# Patient Record
Sex: Male | Born: 2000 | Race: White | Hispanic: No | Marital: Single | Smoking: Never smoker
Health system: Southern US, Community
[De-identification: ages and names within clinical notes are randomized; demographics above are authoritative.]

---
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
## 2018-10-15 ENCOUNTER — Ambulatory Visit (HOSPITAL_COMMUNITY)
Admission: EM | Admit: 2018-10-15 | Discharge: 2018-10-15 | Disposition: A | Discharge status: Home / Self Care | Payer: BLUE CROSS/BLUE SHIELD | Attending: Internal Medicine | Admitting: Internal Medicine

## 2018-10-15 ENCOUNTER — Encounter (HOSPITAL_COMMUNITY): Payer: Self-pay | Admitting: Emergency Medicine

## 2018-10-15 ENCOUNTER — Other Ambulatory Visit: Payer: Self-pay

## 2018-10-15 DIAGNOSIS — H00014 Hordeolum externum left upper eyelid: Secondary | ICD-10-CM

## 2018-10-15 MED ORDER — ERYTHROMYCIN 5 MG/GM OP OINT: TOPICAL_OINTMENT | OPHTHALMIC | 0 refills | Status: AC

## 2018-10-15 NOTE — ED Triage Notes (Signed)
Pt sts left eye pain with swelling x 2 days

## 2018-10-15 NOTE — ED Provider Notes (Signed)
[facility]    CSN: [id number] Arrival date & [id number]: 10/15/18  1941     History   Chief Complaint Chief Complaint  Patient presents with  . Eye Pain    HPI Justin Graves is a 18 y.o. male with no past medical history comes to urgent care with complaints of left eye pain and swelling with increased tearing of the left eye of 2 days duration.  Symptoms started 2 days ago and is gotten progressively worse.  Patient tried warm compress and over-the-counter saline eyedrops with no improvement in his symptoms.  Patient says that the eyelid is painful.  Pain is constant and throbbing.  No relieving factors.  Aggravated by palpation.  No fever or chills.  No double vision.  No blurred vision.   HPI  History reviewed. No pertinent past medical history.  There are no active problems to display for this patient.   History reviewed. No pertinent surgical history.     Home Medications    Prior to Admission medications   Medication Sig Start Date End Date Taking? Authorizing Provider  erythromycin ophthalmic ointment Place a 1/2 inch ribbon of ointment into the lower eyelid. 10/15/18   Lamptey, Britta MccreedyPhilip O, MD    Family History History reviewed. No pertinent family history.  Social History Social History   Tobacco Use  . Smoking status: Never Smoker  . Smokeless tobacco: Never Used  Substance Use Topics  . Alcohol use: Not Currently    Frequency: Never  . Drug use: Never     Allergies   Sulfa antibiotics   Review of Systems Review of Systems  Constitutional: Negative for activity change, chills, fatigue and fever.  Eyes: Positive for pain, discharge and redness. Negative for photophobia, itching and visual disturbance.  Respiratory: Negative.   Cardiovascular: Negative.   Gastrointestinal: Negative.   Genitourinary: Negative.   Musculoskeletal: Negative.   Neurological: Negative for dizziness, weakness, numbness and headaches.     Physical Exam  Triage Vital Signs ED Triage Vitals  Enc Vitals Group     BP 10/15/18 1954 (!) 145/84     Pulse Rate 10/15/18 1954 86     Resp 10/15/18 1954 16     Temp 10/15/18 1954 98.5 F (36.9 C)     Temp Source 10/15/18 1954 Temporal     SpO2 10/15/18 1954 98 %     Weight --      Height --      Head Circumference --      Peak Flow --      Pain Score 10/15/18 1956 5     Pain Loc --      Pain Edu? --      Excl. in GC? --    No data found.  Updated Vital Signs BP (!) 145/84 (BP Location: Right Arm)   Pulse 86   Temp 98.5 F (36.9 C) (Temporal)   Resp 16   SpO2 98%   Visual Acuity Right Eye Distance:   Left Eye Distance:   Bilateral Distance:    Right Eye Near: R Near: 20/25 without corrective lens Left Eye Near:  L Near: 20/25 without corrective lens Bilateral Near:  20/25 without corrective lens  Physical Exam Vitals signs and nursing note reviewed.  Constitutional:      General: He is in acute distress.     Appearance: He is ill-appearing. He is not toxic-appearing.  HENT:     Right Ear: Tympanic membrane normal.  Left Ear: Tympanic membrane normal.     Nose: Nose normal.  Eyes:     Extraocular Movements: Extraocular movements intact.     Pupils: Pupils are equal, round, and reactive to light.     Comments: Mild conjunctival erythema in the left eye.  Left upper eyelid swelling without erythema.  Cardiovascular:     Rate and Rhythm: Normal rate and regular rhythm.  Pulmonary:     Effort: Pulmonary effort is normal.     Breath sounds: Normal breath sounds.  Abdominal:     General: Bowel sounds are normal.     Palpations: Abdomen is soft.  Skin:    General: Skin is warm.     Capillary Refill: Capillary refill takes less than 2 seconds.     Coloration: Skin is not jaundiced.     Findings: No bruising.  Neurological:     General: No focal deficit present.     Mental Status: He is alert and oriented to person, place, and time.      UC Treatments / Results   Labs (all labs ordered are listed, but only abnormal results are displayed) Labs Reviewed - No data to display  EKG   Radiology No results found.  Procedures Procedures (including critical care time)  Medications Ordered in UC Medications - No data to display  Initial Impression / Assessment and Plan / UC Course  I have reviewed the triage vital signs and the nursing notes.  Pertinent labs & imaging results that were available during my care of the patient were reviewed by me and considered in my medical decision making (see chart for details).     1.  Stye: Erythromycin eye ointment Continue warm compress If patient experiences worsening swelling of the left upper eyelid, fever, change in vision or pain with extraocular movement, patient is advised to return to urgent care for reevaluation.   Final Clinical Impressions(s) / UC Diagnoses   Final diagnoses:  Hordeolum externum of left upper eyelid   Discharge Instructions   None    ED Prescriptions    Medication Sig Dispense Auth. Provider   erythromycin ophthalmic ointment Place a 1/2 inch ribbon of ointment into the lower eyelid. 3.5 g Chase Picket, MD     Controlled Substance Prescriptions [place] Controlled Substance Registry consulted? No   Chase Picket, MD 10/17/18 1325

## 2022-04-03 ENCOUNTER — Emergency Department (HOSPITAL_COMMUNITY)
Admission: EM | Admit: 2022-04-03 | Discharge: 2022-04-03 | Disposition: A | Discharge status: Home / Self Care | Payer: No Typology Code available for payment source | Attending: Emergency Medicine | Admitting: Emergency Medicine

## 2022-04-03 ENCOUNTER — Emergency Department (HOSPITAL_COMMUNITY): Payer: No Typology Code available for payment source

## 2022-04-03 ENCOUNTER — Other Ambulatory Visit: Payer: Self-pay

## 2022-04-03 DIAGNOSIS — R410 Disorientation, unspecified: Secondary | ICD-10-CM | POA: Diagnosis not present

## 2022-04-03 DIAGNOSIS — S2020XA Contusion of thorax, unspecified, initial encounter: Secondary | ICD-10-CM

## 2022-04-03 DIAGNOSIS — S20219A Contusion of unspecified front wall of thorax, initial encounter: Secondary | ICD-10-CM | POA: Diagnosis not present

## 2022-04-03 DIAGNOSIS — F1010 Alcohol abuse, uncomplicated: Secondary | ICD-10-CM | POA: Diagnosis not present

## 2022-04-03 DIAGNOSIS — R112 Nausea with vomiting, unspecified: Secondary | ICD-10-CM | POA: Insufficient documentation

## 2022-04-03 DIAGNOSIS — Y92410 Unspecified street and highway as the place of occurrence of the external cause: Secondary | ICD-10-CM | POA: Diagnosis not present

## 2022-04-03 DIAGNOSIS — Y900 Blood alcohol level of less than 20 mg/100 ml: Secondary | ICD-10-CM | POA: Insufficient documentation

## 2022-04-03 DIAGNOSIS — R6884 Jaw pain: Secondary | ICD-10-CM | POA: Diagnosis not present

## 2022-04-03 DIAGNOSIS — R7989 Other specified abnormal findings of blood chemistry: Secondary | ICD-10-CM | POA: Diagnosis not present

## 2022-04-03 DIAGNOSIS — I4510 Unspecified right bundle-branch block: Secondary | ICD-10-CM

## 2022-04-03 DIAGNOSIS — R519 Headache, unspecified: Secondary | ICD-10-CM | POA: Diagnosis not present

## 2022-04-03 DIAGNOSIS — R1084 Generalized abdominal pain: Secondary | ICD-10-CM | POA: Insufficient documentation

## 2022-04-03 DIAGNOSIS — S299XXA Unspecified injury of thorax, initial encounter: Secondary | ICD-10-CM | POA: Diagnosis present

## 2022-04-03 LAB — I-STAT CHEM 8, ED
BUN: 16 mg/dL (ref 6–20)
Calcium, Ion: 1.21 mmol/L (ref 1.15–1.40)
Chloride: 104 mmol/L (ref 98–111)
Creatinine, Ser: 1 mg/dL (ref 0.61–1.24)
Glucose, Bld: 115 mg/dL — ABNORMAL HIGH (ref 70–99)
HCT: 39 % (ref 39.0–52.0)
Hemoglobin: 13.3 g/dL (ref 13.0–17.0)
Potassium: 4.8 mmol/L (ref 3.5–5.1)
Sodium: 142 mmol/L (ref 135–145)
TCO2: 27 mmol/L (ref 22–32)

## 2022-04-03 LAB — COMPREHENSIVE METABOLIC PANEL
ALT: 100 U/L — ABNORMAL HIGH (ref 0–44)
AST: 209 U/L — ABNORMAL HIGH (ref 15–41)
Albumin: 4.3 g/dL (ref 3.5–5.0)
Alkaline Phosphatase: 54 U/L (ref 38–126)
Anion gap: 10 (ref 5–15)
BUN: 14 mg/dL (ref 6–20)
CO2: 25 mmol/L (ref 22–32)
Calcium: 9 mg/dL (ref 8.9–10.3)
Chloride: 103 mmol/L (ref 98–111)
Creatinine, Ser: 1.01 mg/dL (ref 0.61–1.24)
GFR, Estimated: >60 mL/min (ref >60)
Glucose, Bld: 120 mg/dL — ABNORMAL HIGH (ref 70–99)
Potassium: 4.9 mmol/L (ref 3.5–5.1)
Sodium: 138 mmol/L (ref 135–145)
Total Bilirubin: 0.5 mg/dL (ref 0.3–1.2)
Total Protein: 6.7 g/dL (ref 6.5–8.1)

## 2022-04-03 LAB — CBC
HCT: 40.5 % (ref 39.0–52.0)
Hemoglobin: 14.1 g/dL (ref 13.0–17.0)
MCH: 30.4 pg (ref 26.0–34.0)
MCHC: 34.8 g/dL (ref 30.0–36.0)
MCV: 87.3 fL (ref 80.0–100.0)
Platelets: 193 10*3/uL (ref 150–400)
RBC: 4.64 MIL/uL (ref 4.22–5.81)
RDW: 12 % (ref 11.5–15.5)
WBC: 6.7 10*3/uL (ref 4.0–10.5)
nRBC: 0 % (ref 0.0–0.2)

## 2022-04-03 LAB — PROTIME-INR
INR: 1.1 (ref 0.8–1.2)
Prothrombin Time: 14.1 seconds (ref 11.4–15.2)

## 2022-04-03 LAB — SAMPLE TO BLOOD BANK

## 2022-04-03 LAB — URINALYSIS, ROUTINE W REFLEX MICROSCOPIC
Bilirubin Urine: NEGATIVE
Glucose, UA: NEGATIVE mg/dL
Hgb urine dipstick: NEGATIVE
Ketones, ur: NEGATIVE mg/dL
Leukocytes,Ua: NEGATIVE
Nitrite: NEGATIVE
Protein, ur: NEGATIVE mg/dL
Specific Gravity, Urine: 1.025 (ref 1.005–1.030)
pH: 6 (ref 5.0–8.0)

## 2022-04-03 LAB — LACTIC ACID, PLASMA
Lactic Acid, Venous: 2.4 mmol/L (ref 0.5–1.9)
Lactic Acid, Venous: 0.9 mmol/L (ref 0.5–1.9)

## 2022-04-03 LAB — ETHANOL: Alcohol, Ethyl (B): 17 mg/dL — ABNORMAL HIGH (ref <10)

## 2022-04-03 MED ORDER — ONDANSETRON HCL 4 MG/2ML IJ SOLN: 4.0000 mg | Freq: Once | INTRAMUSCULAR | Status: DC

## 2022-04-03 MED ORDER — IOHEXOL 350 MG/ML SOLN
75.0000 mL | Freq: Once | INTRAVENOUS | Status: AC | PRN
  Administered 2022-04-03: 75 mL via INTRAVENOUS

## 2022-04-03 MED ORDER — SODIUM CHLORIDE 0.9 % IV BOLUS
1000.0000 mL | Freq: Once | INTRAVENOUS | Status: AC
  Administered 2022-04-03: 1000 mL via INTRAVENOUS

## 2022-04-03 MED ORDER — IBUPROFEN 400 MG PO TABS
400.0000 mg | ORAL_TABLET | Freq: Once | ORAL | Status: AC
  Administered 2022-04-03: 400 mg via ORAL
  Filled 2022-04-03: qty 1

## 2022-04-03 MED ORDER — METHOCARBAMOL 500 MG PO TABS: 500.0000 mg | ORAL_TABLET | Freq: Two times a day (BID) | ORAL | 0 refills | Status: AC
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
## 2022-04-03 NOTE — ED Notes (Signed)
Patient transported to CT 

## 2022-04-03 NOTE — ED Notes (Signed)
Patient transported to X-ray 

## 2022-04-03 NOTE — Discharge Instructions (Addendum)
At this time there does not appear to be the presence of an emergent medical condition, however there is always the potential for conditions to change. Please read and follow the below instructions.  Please return to the Emergency Department immediately for any new or worsening symptoms. Please be sure to follow up with your Primary Care Provider within one week regarding your visit today; please call their office to schedule an appointment even if you are feeling better for a follow-up visit. Your liver function tests were elevated today.  This may be due to your alcohol use.  Please have your liver function tests rechecked in the next week or so to ensure there is improvement.  If you develop any abdominal pain nausea or vomiting or if you develop any jaundice (yellow color of the skin or eyes) please return to the emergency department immediately. Do not take any Tylenol until you are cleared to do so by your primary care provider.  This is because Tylenol is processed by the liver and can worsen your liver function. You may use the muscle relaxer Robaxin as prescribed to help with your symptoms.  Do not drive or operate heavy machinery while taking Robaxin as it will make you drowsy.  Do not drink alcohol or take other sedating medications while taking Robaxin as this will worsen side effects. As we discussed declined a CT scan of your face/jaw.  If you have worsening pain or inability to chew or open your mouth please return to the ER for further imaging.  Please follow-up with your primary care provider or a dentist or ear nose and throat specialist. Please see your primary care provider this week to discuss your imaging results, lab results and your EKG.   Please read the additional information packets attached to your discharge summary.  Go to the nearest Emergency Department immediately if: You have fever or chills You have shortness of breath. You have abdominal pain or vomiting You have  light-headedness or you faint. You have chest pain. You have: A very bad headache that is not helped by medicine. Trouble walking or weakness in your arms and legs. Clear or bloody fluid coming from your nose or ears. Changes in how you see (vision). A seizure. More confusion or more grumpy moods. Your symptoms get worse. You are sleepier than normal and have trouble staying awake. You lose your balance. The black centers of your eyes (pupils) change in size. Your speech is slurred. Your dizziness gets worse. You vomit. You have these eye or vision changes: Sudden vision loss or double vision. Your eye suddenly turns red. The black center of your eye (pupil) is an odd shape or size.      You have any new/concerning or worsening of symptoms  Do not take your medicine if  develop an itchy rash, swelling in your mouth or lips, or difficulty breathing; call 911 and seek immediate emergency medical attention if this occurs.  You may review your lab tests and imaging results in their entirety on your MyChart account.  Please discuss all results of fully with your primary care provider and other specialist at your follow-up visit.  Note: Portions of this text may have been transcribed using voice recognition software. Every effort was made to ensure accuracy; however, inadvertent computerized transcription errors may still be present.
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
## 2022-04-03 NOTE — Progress Notes (Signed)
Orthopedic Tech Progress Note Patient Details:  SUE MCALEXANDER May 06, 2000 [id number]  Patient ID: Althea Charon, male   DOB: Jul 23, 2000, 22 y.o.   MRN: [id number] Level II; not currently needed. Vernona Rieger 04/03/2022, 8:06 AM
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
## 2022-10-03 ENCOUNTER — Emergency Department (HOSPITAL_BASED_OUTPATIENT_CLINIC_OR_DEPARTMENT_OTHER): Payer: Self-pay

## 2022-10-03 ENCOUNTER — Emergency Department (HOSPITAL_BASED_OUTPATIENT_CLINIC_OR_DEPARTMENT_OTHER)
Admission: EM | Admit: 2022-10-03 | Discharge: 2022-10-03 | Disposition: A | Discharge status: Home / Self Care | Payer: Self-pay | Attending: Emergency Medicine | Admitting: Emergency Medicine

## 2022-10-03 ENCOUNTER — Encounter (HOSPITAL_BASED_OUTPATIENT_CLINIC_OR_DEPARTMENT_OTHER): Payer: Self-pay

## 2022-10-03 ENCOUNTER — Other Ambulatory Visit: Payer: Self-pay

## 2022-10-03 DIAGNOSIS — M7989 Other specified soft tissue disorders: Secondary | ICD-10-CM | POA: Insufficient documentation

## 2022-10-03 DIAGNOSIS — S80211A Abrasion, right knee, initial encounter: Secondary | ICD-10-CM | POA: Insufficient documentation

## 2022-10-03 DIAGNOSIS — S8992XA Unspecified injury of left lower leg, initial encounter: Secondary | ICD-10-CM

## 2022-10-03 DIAGNOSIS — Y92410 Unspecified street and highway as the place of occurrence of the external cause: Secondary | ICD-10-CM | POA: Insufficient documentation

## 2022-10-03 DIAGNOSIS — S80212A Abrasion, left knee, initial encounter: Secondary | ICD-10-CM | POA: Insufficient documentation
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
## 2022-10-03 NOTE — Discharge Instructions (Addendum)
Return for ct scan.  The Radiologist is concerned that you may have a fracture to tibial plateau of your knee.  Ice to area of pain
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
## 2024-02-19 ENCOUNTER — Emergency Department (HOSPITAL_BASED_OUTPATIENT_CLINIC_OR_DEPARTMENT_OTHER)
Admission: EM | Admit: 2024-02-19 | Discharge: 2024-02-19 | Disposition: A | Discharge status: Home / Self Care | Payer: Self-pay | Attending: Emergency Medicine | Admitting: Emergency Medicine

## 2024-02-19 ENCOUNTER — Encounter (HOSPITAL_BASED_OUTPATIENT_CLINIC_OR_DEPARTMENT_OTHER): Payer: Self-pay | Admitting: Emergency Medicine

## 2024-02-19 ENCOUNTER — Emergency Department (HOSPITAL_BASED_OUTPATIENT_CLINIC_OR_DEPARTMENT_OTHER): Payer: Self-pay | Admitting: Radiology

## 2024-02-19 ENCOUNTER — Other Ambulatory Visit: Payer: Self-pay

## 2024-02-19 DIAGNOSIS — J111 Influenza due to unidentified influenza virus with other respiratory manifestations: Secondary | ICD-10-CM

## 2024-02-19 LAB — BASIC METABOLIC PANEL WITH GFR
Anion gap: 11 (ref 5–15)
BUN: 13 mg/dL (ref 6–20)
CO2: 26 mmol/L (ref 22–32)
Calcium: 10.2 mg/dL (ref 8.9–10.3)
Chloride: 99 mmol/L (ref 98–111)
Creatinine, Ser: 1.08 mg/dL (ref 0.61–1.24)
GFR, Estimated: >60 mL/min (ref >60)
Glucose, Bld: 102 mg/dL — ABNORMAL HIGH (ref 70–99)
Potassium: 4.2 mmol/L (ref 3.5–5.1)
Sodium: 136 mmol/L (ref 135–145)

## 2024-02-19 LAB — CBC
HCT: 45 % (ref 39.0–52.0)
Hemoglobin: 15.6 g/dL (ref 13.0–17.0)
MCH: 29.3 pg (ref 26.0–34.0)
MCHC: 34.7 g/dL (ref 30.0–36.0)
MCV: 84.4 fL (ref 80.0–100.0)
Platelets: 187 K/uL (ref 150–400)
RBC: 5.33 MIL/uL (ref 4.22–5.81)
RDW: 12.2 % (ref 11.5–15.5)
WBC: 9.8 K/uL (ref 4.0–10.5)
nRBC: 0 % (ref 0.0–0.2)

## 2024-02-19 LAB — RESP PANEL BY RT-PCR (RSV, FLU A&B, COVID)  RVPGX2
Influenza A by PCR: POSITIVE — AB
Influenza B by PCR: NEGATIVE
Resp Syncytial Virus by PCR: NEGATIVE
SARS Coronavirus 2 by RT PCR: NEGATIVE

## 2024-02-19 LAB — D-DIMER, QUANTITATIVE: D-Dimer, Quant: 0.36 ug{FEU}/mL (ref 0.00–0.50)

## 2024-02-19 LAB — TROPONIN T, HIGH SENSITIVITY: Troponin T High Sensitivity: <15 ng/L (ref 0–19)

## 2024-02-19 MED ORDER — DM-GUAIFENESIN ER 30-600 MG PO TB12: 1.0000 | ORAL_TABLET | Freq: Two times a day (BID) | ORAL | 0 refills | Status: AC | PRN

## 2024-02-19 MED ORDER — SODIUM CHLORIDE 0.9 % IV BOLUS
1000.0000 mL | Freq: Once | INTRAVENOUS | Status: AC
  Administered 2024-02-19: 09:00:00 1000 mL via INTRAVENOUS

## 2024-02-19 MED ORDER — KETOROLAC TROMETHAMINE 30 MG/ML IJ SOLN
30.0000 mg | Freq: Once | INTRAMUSCULAR | Status: AC
  Administered 2024-02-19: 09:00:00 30 mg via INTRAVENOUS
  Filled 2024-02-19: qty 1

## 2024-02-19 NOTE — ED Triage Notes (Signed)
 Pt caox4 c/o CP and SOB for a couple months, further reports syncope last night. Chills and weakness last night. Last took tylenol at 0300. Pt also states I think this is from vaping.

## 2024-02-19 NOTE — ED Notes (Signed)
 Called lab to run d-dimer off hold blood, spoke with Sherrilyn.

## 2024-02-19 NOTE — ED Provider Notes (Signed)
 [place] EMERGENCY DEPARTMENT AT [facility] Provider Note   CSN: [id number] Arrival date & time: 02/19/24  9276     Patient presents with: Loss of Consciousness   Justin Graves is a 23 y.o. male.   HPI   23 year old male presents emergency department with concern for intermittent chest pain and shortness of breath for couple of months, becoming more significant these past couple days with generalized bodyaches.  He states that the pain is midsternal, sharp, brief.  He does admit to increased vaping and believes his symptoms are secondary to this.  He has a productive cough of phlegm but no blood.  Denies any documented fever/chills.  No active chest pain at this time.  Denies any back pain or leg swelling.  Told staff in triage that he had a syncopal episode last night but has a difficult time characterizing this or further describing it to me.  Prior to Admission medications  Medication Sig Start Date End Date Taking? Authorizing Provider  erythromycin  ophthalmic ointment Place a 1/2 inch ribbon of ointment into the lower eyelid. 10/15/18   LampteyAleene KIDD, MD    Allergies: Sulfa antibiotics    Review of Systems  Constitutional:  Positive for fatigue. Negative for fever.  Respiratory:  Positive for cough, chest tightness and shortness of breath.   Cardiovascular:  Positive for chest pain.  Gastrointestinal:  Negative for abdominal pain, diarrhea and vomiting.  Musculoskeletal:  Positive for myalgias.  Skin:  Negative for rash.  Neurological:  Negative for headaches.    Updated Vital Signs BP 139/75   Pulse 85   Temp 98.2 F (36.8 C) (Oral)   Resp 16   Ht 6' 2 (1.88 m)   Wt 111.1 kg   SpO2 99%   BMI 31.46 kg/m   Physical Exam Vitals and nursing note reviewed.  Constitutional:      General: He is not in acute distress.    Appearance: Normal appearance.  HENT:     Head: Normocephalic.     Mouth/Throat:     Mouth: Mucous membranes are moist.   Cardiovascular:     Rate and Rhythm: Normal rate.  Pulmonary:     Effort: Pulmonary effort is normal. No respiratory distress.     Breath sounds: No rales.  Abdominal:     Palpations: Abdomen is soft.     Tenderness: There is no abdominal tenderness.  Musculoskeletal:        General: No swelling.  Skin:    General: Skin is warm.  Neurological:     Mental Status: He is alert and oriented to person, place, and time. Mental status is at baseline.  Psychiatric:        Mood and Affect: Mood normal.     (all labs ordered are listed, but only abnormal results are displayed) Labs Reviewed  RESP PANEL BY RT-PCR (RSV, FLU A&B, COVID)  RVPGX2 - Abnormal; Notable for the following components:      Result Value   Influenza A by PCR POSITIVE (*)    All other components within normal limits  BASIC METABOLIC PANEL WITH GFR - Abnormal; Notable for the following components:   Glucose, Bld 102 (*)    All other components within normal limits  CBC  D-DIMER, QUANTITATIVE  TROPONIN T, HIGH SENSITIVITY  TROPONIN T, HIGH SENSITIVITY    EKG: EKG Interpretation Date/Time:  Monday February 19 2024 07:35:32 EST Ventricular Rate:  98 PR Interval:  140 QRS Duration:  105 QT  Interval:  343 QTC Calculation: 438 R Axis:   81  Text Interpretation: Sinus rhythm Probable left atrial enlargement RSR' in V1 or V2, right VCD or RVH Abnormal inferior Q waves similar to previous Confirmed by Bari Flank [phone]) on 02/19/2024 7:44:33 AM  Radiology: [facility] Chest 2 View Result Date: 02/19/2024 EXAM: 2 VIEW(S) XRAY OF THE CHEST 02/19/2024 07:56:23 AM COMPARISON: CT chest, abdomen, and pelvis 04/03/2022 and earlier. CLINICAL HISTORY: 23 year old male with chest pain. FINDINGS: LUNGS AND PLEURA: No focal pulmonary opacity. No pleural effusion. No pneumothorax. HEART AND MEDIASTINUM: No acute abnormality of the cardiac and mediastinal silhouettes. BONES AND SOFT TISSUES: No acute osseous abnormality. Negative  visible bowel gas. IMPRESSION: 1. Negative; no cardiopulmonary abnormality identified. Electronically signed by: Helayne Hurst MD 02/19/2024 08:13 AM EST RP Workstation: [id number]     Procedures   Medications Ordered in the ED  sodium chloride  0.9 % bolus 1,000 mL (1,000 mLs Intravenous New Bag/Given 02/19/24 0839)  ketorolac  (TORADOL ) 30 MG/ML injection 30 mg (30 mg Intravenous Given 02/19/24 0840)                                    Medical Decision Making Amount and/or Complexity of Data Reviewed Labs: ordered. Radiology: ordered.  Risk OTC drugs. Prescription drug management.   23 year old male presents emergency department with ongoing chest pain/tightness now with generalized fatigue, myalgias.  No documented fever at home.  Endorses a possible syncopal episode at home last night.  EKG is unchanged for the patient.  Blood work is reassuring, troponin is negative, dimer is normal.  Influenza testing is positive for influenza A.  Chest x-ray shows no acute abnormality.  Today it seems like his symptoms are secondary to influenza.  Symptomatic treatment of this diagnosis discussed with the patient.  However the shortness of breath/chest symptoms that have been ongoing for the past couple weeks will require further outpatient evaluation.  No acute finding on workup today.  Cardiology recommendations given.    Consulted patient on daily vaping. Counseled patient for approximately 10 minutes regarding smoking cessation. Discussed risks of smoking and how they applied and affected their visit here today. Patient is ready to quit at this time, however will follow up with their primary doctor when they are.   CPT code: 00593: intermediate counseling for smoking cessation  Patient at this time appears safe and stable for discharge and close outpatient follow up. Discharge plan and strict return to ED precautions discussed, patient verbalizes understanding and agreement.     Final  diagnoses:  None    ED Discharge Orders     None          Bari Flank HERO, DO 02/19/24 0945

## 2024-02-19 NOTE — Discharge Instructions (Signed)
 You have been seen and discharged from the emergency department.  You have been diagnosed with influenza.  You may take over-the-counter medication for symptom relief.  Take Tylenol/ibuprofen  for pain and fever control.  Take Mucinex  as needed for cough/mucus improvement.  Follow-up with your primary provider for further evaluation and further care. Take home medications as prescribed. If you have any worsening symptoms or further concerns for your health please return to an emergency department for further evaluation.

## 2024-04-03 ENCOUNTER — Emergency Department (HOSPITAL_BASED_OUTPATIENT_CLINIC_OR_DEPARTMENT_OTHER): Payer: Self-pay

## 2024-04-03 ENCOUNTER — Emergency Department (HOSPITAL_BASED_OUTPATIENT_CLINIC_OR_DEPARTMENT_OTHER)
Admission: EM | Admit: 2024-04-03 | Discharge: 2024-04-03 | Disposition: A | Discharge status: Home / Self Care | Payer: Self-pay | Attending: Emergency Medicine | Admitting: Emergency Medicine

## 2024-04-03 ENCOUNTER — Other Ambulatory Visit: Payer: Self-pay

## 2024-04-03 DIAGNOSIS — R42 Dizziness and giddiness: Secondary | ICD-10-CM | POA: Insufficient documentation

## 2024-04-03 DIAGNOSIS — R4189 Other symptoms and signs involving cognitive functions and awareness: Secondary | ICD-10-CM | POA: Insufficient documentation

## 2024-04-03 NOTE — ED Notes (Signed)
 Pt d/c instructions, medications, and follow-up care reviewed with pt. Pt verbalized understanding and had no further questions at time of d/c. Pt CA&Ox4, ambulatory, and in NAD at time of d/c

## 2024-04-03 NOTE — Discharge Instructions (Addendum)
 It was a pleasure meeting with you today. As we discussed there were no acute findings on today's CT Head imaging. I strongly recommend follow-up with primary care for additional work-up including blood work, EKG, and repeat physical exam with reassessment of symptoms. While there was no evidence of acute intracranial abnormality on imaging today, dizziness and brain fog can be caused by a myriad of things including cardiac issues, metabolic disorders, electrolyte imbalance, and arrhythmias to name a few. Return to ED if symptoms worsen or new concerning symptoms develop.   I have also included a local ENT office and neurology office contact information. Consider reaching out to these offices for additional evaluation if symptoms continue; ENT if left ear muffled hearing and clogged feeling persists and neurology if brain fog, fatigue persists.

## 2024-04-03 NOTE — ED Provider Notes (Signed)
 " [place] [facility] DEPARTMENT AT [facility] Provider Note   CSN: [id number] Arrival date & time: 04/03/24  1142     Patient presents with: Dizziness   Justin Graves is a 24 y.o. male.   Patient is here for evaluation of headaches, dizziness, brain fog, and trouble concentrating for the past 2 weeks. Patient was recently seen at our facility on 12/15 and tested positive for influenza A at that time.  Patient sustained a fall while snowboarding on 1/17, this was from a few feet off the ground and into the hard packed snow.  He hit the left side of his head.  He denies LOC, N/V, confusion, or seizures associated with his fall.  He is not taking a blood thinner.  Patient believes his symptoms are either sequela from recent influenza or secondary to a concussion sustained from his recent fall.  He denies any gait changes, weakness, numbness/tingling, change in behavior, or additional falls.  When asked about visual changes, he describes as inability to concentrate and denies blurred vision.  He does not wear corrective lenses and cannot recall the last time that he has been to an eye doctor.  He denies alcohol use. He does report recreational marijuana use, I use it to help me go to sleep, which is several times a week. He goes on to say he has been sleeping more than usual. Denies recent fevers. Does report muffled hearing of the left ear and feeling as though the left ear is 'clogged'.  Patient is requesting a CT scan of his head.  He does not want any additional workup going on to say he had lab work and EKG performed at his 12/15 ED visit which cost him several thousand dollars and he is paying out-of-pocket so he would prefer not to have this testing repeated.  It was explained to patient that these results could change greatly in the span of over 1 month and may be influencing his current symptoms.  He again declines blood work or EKG, stating he will instead follow-up with his  primary care next week for these tests.  The history is provided by the patient.  Dizziness Quality:  Unable to specify      Prior to Admission medications  Medication Sig Start Date End Date Taking? Authorizing Provider  dextromethorphan-guaiFENesin  (MUCINEX  DM) 30-600 MG 12hr tablet Take 1 tablet by mouth 2 (two) times daily as needed for cough. 02/19/24   Horton, Roxie HERO, DO  erythromycin  ophthalmic ointment Place a 1/2 inch ribbon of ointment into the lower eyelid. 10/15/18   LampteyAleene KIDD, MD    Allergies: Sulfa antibiotics    Review of Systems  Neurological:  Positive for dizziness.   Vitals:   04/03/24 1215 04/03/24 1230  BP: 126/87 122/72  Pulse: 84 77  Resp:  18  Temp: 98.6 F (37 C)   SpO2: 99% 100%   Updated Vital Signs BP 122/72   Pulse 77   Temp 98.6 F (37 C) (Oral)   Resp 18   SpO2 100%   Physical Exam Vitals and nursing note reviewed.  Constitutional:      General: He is not in acute distress.    Appearance: Normal appearance. He is normal weight. He is not ill-appearing, toxic-appearing or diaphoretic.  HENT:     Head: Normocephalic and atraumatic.     Right Ear: Tympanic membrane, ear canal and external ear normal.     Left Ear: Tympanic membrane, ear canal and  external ear normal.     Nose: Nose normal.     Mouth/Throat:     Mouth: Mucous membranes are moist.     Pharynx: Oropharynx is clear. No oropharyngeal exudate or posterior oropharyngeal erythema.  Eyes:     General: No scleral icterus.       Right eye: No discharge.        Left eye: No discharge.     Extraocular Movements: Extraocular movements intact.     Conjunctiva/sclera: Conjunctivae normal.     Pupils: Pupils are equal, round, and reactive to light.  Cardiovascular:     Rate and Rhythm: Normal rate and regular rhythm.     Pulses: Normal pulses.     Heart sounds: Normal heart sounds. No murmur heard. Pulmonary:     Effort: Pulmonary effort is normal. No respiratory  distress.     Breath sounds: Normal breath sounds. No stridor. No wheezing, rhonchi or rales.  Musculoskeletal:        General: Normal range of motion.     Cervical back: Normal range of motion and neck supple. No rigidity or tenderness.     Right lower leg: No edema.     Left lower leg: No edema.  Skin:    General: Skin is warm and dry.     Capillary Refill: Capillary refill takes less than 2 seconds.     Coloration: Skin is not jaundiced or pale.  Neurological:     Mental Status: He is alert and oriented to person, place, and time.     Sensory: No sensory deficit.     Motor: No weakness.     Coordination: Coordination normal.     Gait: Gait normal.     (all labs ordered are listed, but only abnormal results are displayed) Labs Reviewed - No data to display  EKG: None  Radiology: CT Head Wo Contrast Result Date: 04/03/2024 EXAM: CT HEAD WITHOUT CONTRAST 04/03/2024 12:47:54 PM TECHNIQUE: CT of the head was performed without the administration of intravenous contrast. Automated exposure control, iterative reconstruction, and/or weight based adjustment of the mA/kV was utilized to reduce the radiation dose to as low as reasonably achievable. COMPARISON: Head CT 04/03/2022. CLINICAL HISTORY: Head trauma, moderate to severe. Dizziness and headaches. Fall while snowboarding 2 weeks ago. FINDINGS: BRAIN AND VENTRICLES: There is no evidence of an acute infarct, intracranial hemorrhage, mass, midline shift, hydrocephalus, or extra-axial fluid collection. Cerebral volume is normal. ORBITS: No acute abnormality. SINUSES: Small left mastoid effusion. The included paranasal sinuses are clear. SOFT TISSUES AND SKULL: No acute soft tissue abnormality. No skull fracture. IMPRESSION: 1. No acute intracranial abnormality. Electronically signed by: Dasie Hamburg MD 04/03/2024 01:23 PM EST RP Workstation: [id number]     Procedures   Medications Ordered in the ED - No data to display   Patient  presents to the ED for concern of dizziness, this involves an extensive number of treatment options, and is a complaint that carries with it a high risk of complications and morbidity.  The differential diagnosis includes intracranial hemorrhage, electrolyte imbalance, arrhythmia, viral illness, metabolic disorder, dehydration, hypovolemia, ACS, concussion, TBI, murmur.   Additional history obtained:  Additional history obtained from Outside Medical Records   External records from outside source obtained and reviewed including review of recent lab work.   Lab Tests:  Patient declined.   Imaging Studies ordered:  I ordered imaging studies including CT head without contrast  I independently visualized and interpreted imaging which showed no acute intracranial  abnormalities.  I agree with the radiologist interpretation   Cardiac Monitoring:  Patient declined.   Test Considered:  CBC & BMP: Consideration for systemic illness, electrolyte imbalance, or metabolic disorder contributing to symptoms. ECG: Consideration for ACS or arrhythmia contributing to symptoms.   Problem List / ED Course:     Patient refused additional work-up beyond CT Head imaging.  Explained to patient at length the many etiologies of dizziness and fatigue with strong recommendation for blood work and ECG.  Patient again declined.  Patient is afebrile, normotensive, and pulse that is regular in rhythm and rate, making infection, arrhythmia, and hypovolemia less likely. Physical exam with no abnormal lung sounds or heart murmurs auscultated. Considering recent head injury and timing of symptoms, symptoms are likely secondary to concussion/minor TBI though cannot rule out other etiologies without additional work up.  Dizziness, brain fog, fatigue. CT head without acute intracranial abnormalities. Vitals and physical exam are reassuring. Patient declined additional work up at this time with plans to follow-up  with PCP next week for blood work and ECG. Return precautions discussed and patient verbalized understanding. Stable for discharge. Left sided muffled hearing. No evidence of AOM, AOE, otitis effusion, or impacted cerumen on exam. No pain with palpation over the mastoid so low suspicion for mastoiditis. CT imaging does show small left-sided mastoid effusion. Encourage use of Flonase and follow-up with primary care for ongoing evaluation of improvement of symptoms. I will also include information for local ENT office and recommend patient make an appointment if symptoms persist.   Reevaluation:  After the interventions noted above, I reevaluated the patient and found that they have :stayed the same   Dispostion:  After consideration of the diagnostic results and the patients response to treatment, I feel that the patent would benefit from supportive care in the home setting, continued monitoring of symptoms, and close follow-up with PCP for additional work up and management. Return precautions given.                                    Medical Decision Making Amount and/or Complexity of Data Reviewed Labs: ordered. Radiology: ordered.   This note was produced using [employer]. While the provider has reviewed and verified all clinical information, transcription errors may remain.    Final diagnoses:  Dizziness  Brain fog    ED Discharge Orders     None          Rosina Almarie DELENA DEVONNA 04/03/24 2208  "

## 2024-04-03 NOTE — ED Triage Notes (Signed)
 Reports dizziness and headache since having flu at end of December.  Also had fall while snowboarding 2 weeks ago.   Ambulatory. No unilateral deficits,
